# Patient Record
Sex: Female | Born: 1945 | Race: White | Hispanic: No | Smoking: Former smoker
Health system: Southern US, Community
[De-identification: ages and names within clinical notes are randomized; demographics above are authoritative.]

## PROBLEM LIST (undated history)

## (undated) DIAGNOSIS — E785 Hyperlipidemia, unspecified: Secondary | ICD-10-CM

## (undated) DIAGNOSIS — I351 Nonrheumatic aortic (valve) insufficiency: Secondary | ICD-10-CM

## (undated) DIAGNOSIS — I340 Nonrheumatic mitral (valve) insufficiency: Secondary | ICD-10-CM

## (undated) HISTORY — DX: Nonrheumatic aortic (valve) insufficiency: I35.1

## (undated) HISTORY — DX: Nonrheumatic mitral (valve) insufficiency: I34.0

## (undated) HISTORY — DX: Hyperlipidemia, unspecified: E78.5

## (undated) HISTORY — PX: APPENDECTOMY: SHX54

---

## 1997-08-16 ENCOUNTER — Other Ambulatory Visit: Admission: RE | Admit: 1997-08-16 | Discharge: 1997-08-16 | Payer: Self-pay | Admitting: Obstetrics and Gynecology

## 1998-07-11 ENCOUNTER — Encounter: Payer: Self-pay | Admitting: Obstetrics and Gynecology

## 1998-07-11 ENCOUNTER — Ambulatory Visit (HOSPITAL_COMMUNITY): Admission: RE | Admit: 1998-07-11 | Discharge: 1998-07-11 | Payer: Self-pay | Admitting: Obstetrics and Gynecology

## 1998-07-18 ENCOUNTER — Encounter: Payer: Self-pay | Admitting: Obstetrics and Gynecology

## 1998-07-18 ENCOUNTER — Ambulatory Visit (HOSPITAL_COMMUNITY): Admission: RE | Admit: 1998-07-18 | Discharge: 1998-07-18 | Payer: Self-pay | Admitting: Obstetrics and Gynecology

## 1998-10-03 ENCOUNTER — Other Ambulatory Visit: Admission: RE | Admit: 1998-10-03 | Discharge: 1998-10-03 | Payer: Self-pay | Admitting: Obstetrics and Gynecology

## 1999-03-20 ENCOUNTER — Other Ambulatory Visit: Admission: RE | Admit: 1999-03-20 | Discharge: 1999-03-20 | Payer: Self-pay | Admitting: Obstetrics and Gynecology

## 1999-03-20 ENCOUNTER — Encounter (INDEPENDENT_AMBULATORY_CARE_PROVIDER_SITE_OTHER): Payer: Self-pay | Admitting: Specialist

## 1999-12-18 ENCOUNTER — Ambulatory Visit (HOSPITAL_COMMUNITY): Admission: RE | Admit: 1999-12-18 | Discharge: 1999-12-18 | Payer: Self-pay | Admitting: Obstetrics and Gynecology

## 1999-12-18 ENCOUNTER — Encounter: Payer: Self-pay | Admitting: Obstetrics and Gynecology

## 2000-02-14 ENCOUNTER — Other Ambulatory Visit: Admission: RE | Admit: 2000-02-14 | Discharge: 2000-02-14 | Payer: Self-pay | Admitting: Obstetrics and Gynecology

## 2000-04-30 ENCOUNTER — Other Ambulatory Visit: Admission: RE | Admit: 2000-04-30 | Discharge: 2000-04-30 | Payer: Self-pay | Admitting: Obstetrics and Gynecology

## 2000-04-30 ENCOUNTER — Encounter (INDEPENDENT_AMBULATORY_CARE_PROVIDER_SITE_OTHER): Payer: Self-pay

## 2000-05-24 ENCOUNTER — Ambulatory Visit (HOSPITAL_COMMUNITY): Admission: RE | Admit: 2000-05-24 | Discharge: 2000-05-24 | Payer: Self-pay | Admitting: *Deleted

## 2001-02-24 ENCOUNTER — Encounter: Payer: Self-pay | Admitting: Obstetrics and Gynecology

## 2001-02-24 ENCOUNTER — Encounter: Admission: RE | Admit: 2001-02-24 | Discharge: 2001-02-24 | Payer: Self-pay | Admitting: Obstetrics and Gynecology

## 2004-06-26 ENCOUNTER — Other Ambulatory Visit: Admission: RE | Admit: 2004-06-26 | Discharge: 2004-06-26 | Payer: Self-pay | Admitting: Endocrinology

## 2005-05-14 ENCOUNTER — Encounter: Admission: RE | Admit: 2005-05-14 | Discharge: 2005-05-14 | Payer: Self-pay | Admitting: Endocrinology

## 2007-12-31 ENCOUNTER — Encounter: Admission: RE | Admit: 2007-12-31 | Discharge: 2007-12-31 | Payer: Self-pay | Admitting: Endocrinology

## 2007-12-31 ENCOUNTER — Other Ambulatory Visit: Admission: RE | Admit: 2007-12-31 | Discharge: 2007-12-31 | Payer: Self-pay | Admitting: Endocrinology

## 2007-12-31 IMAGING — MG MM SCREEN MAMMOGRAM BILATERAL
4 series · 4 of 4 positions shown · non-contrast
Comparison: Prior studies.

DG SCREEN MAMMOGRAM BILATERAL
Bilateral CC and MLO view(s) were taken.
Prior study comparison: [DATE], bilateral screening mammogram.

DIGITAL SCREENING MAMMOGRAM WITH CAD:

[R CC]
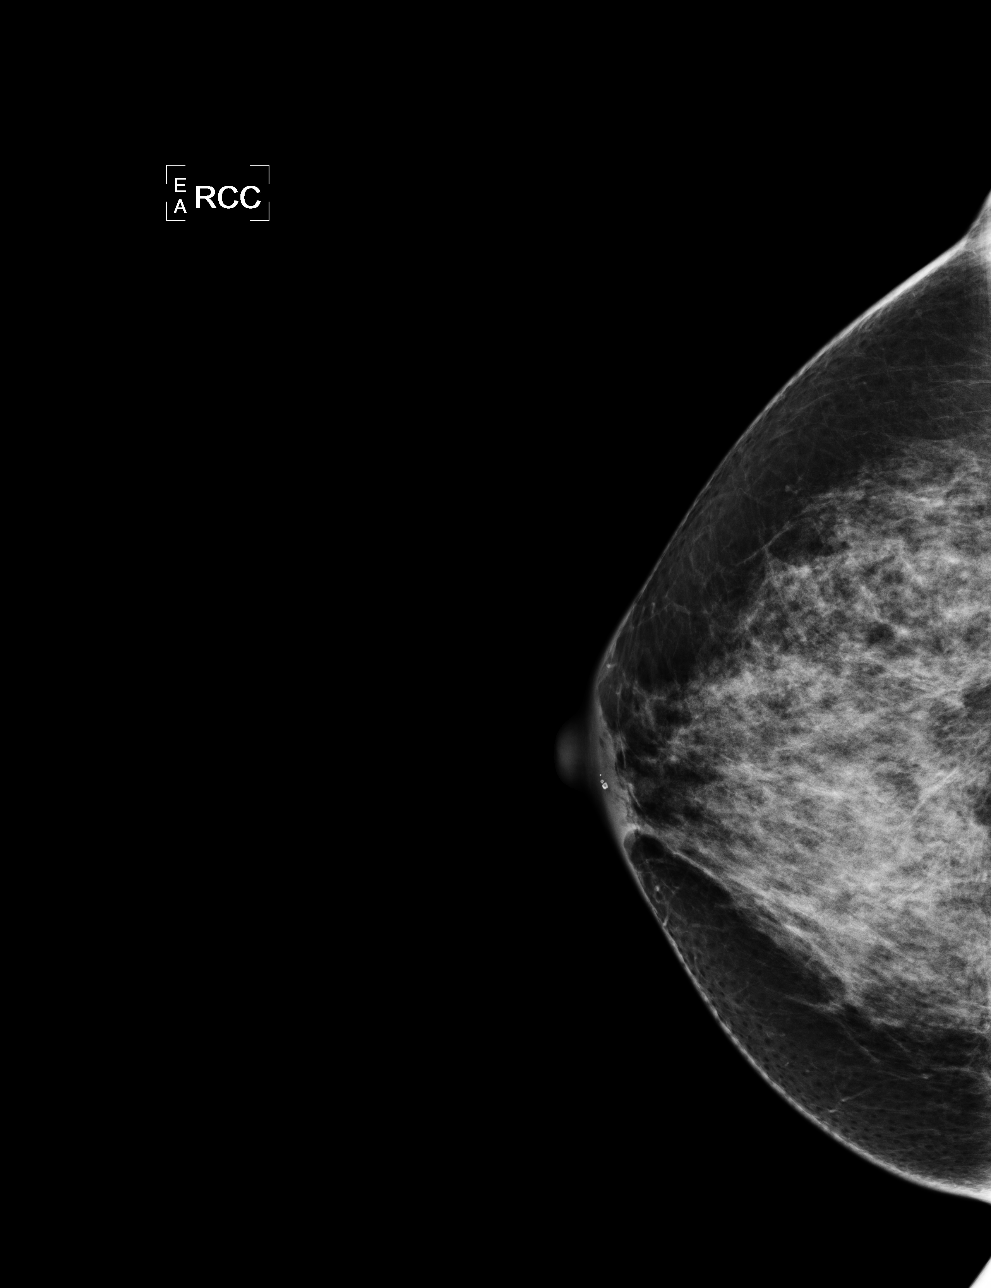

[L CC]
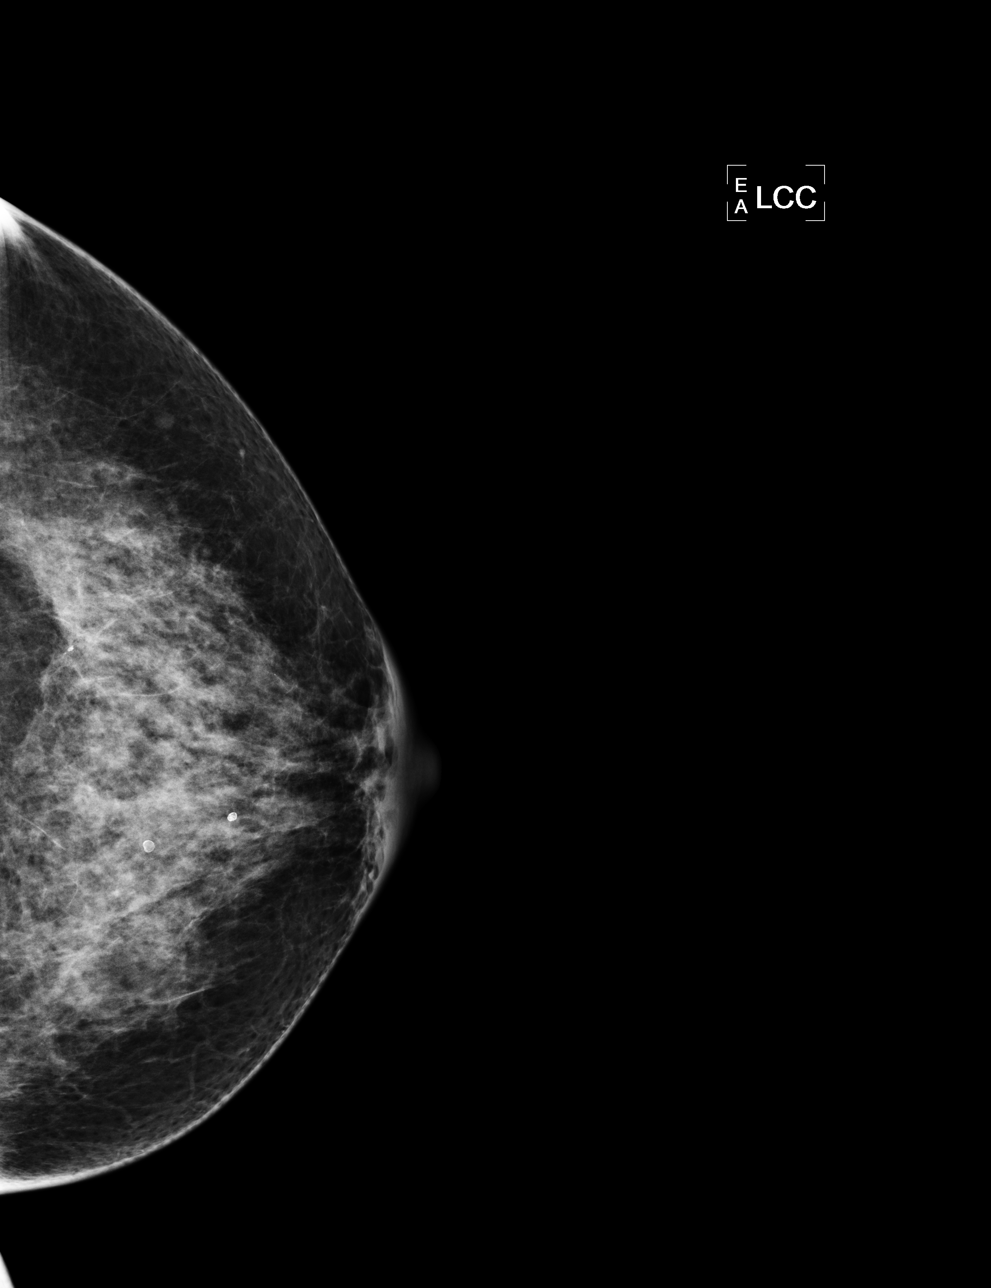

[L MLO]
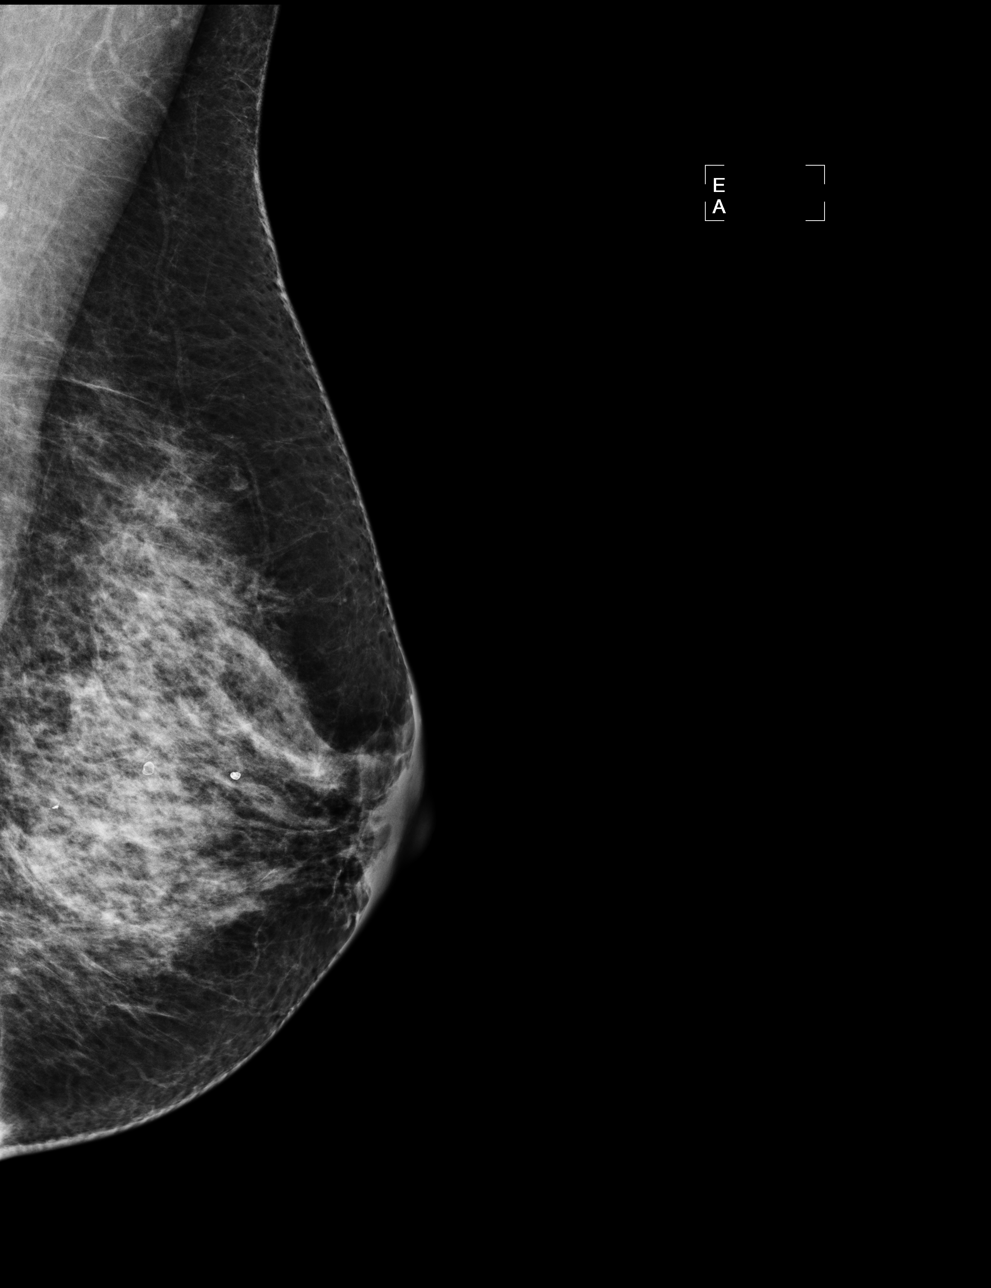

[R MLO]
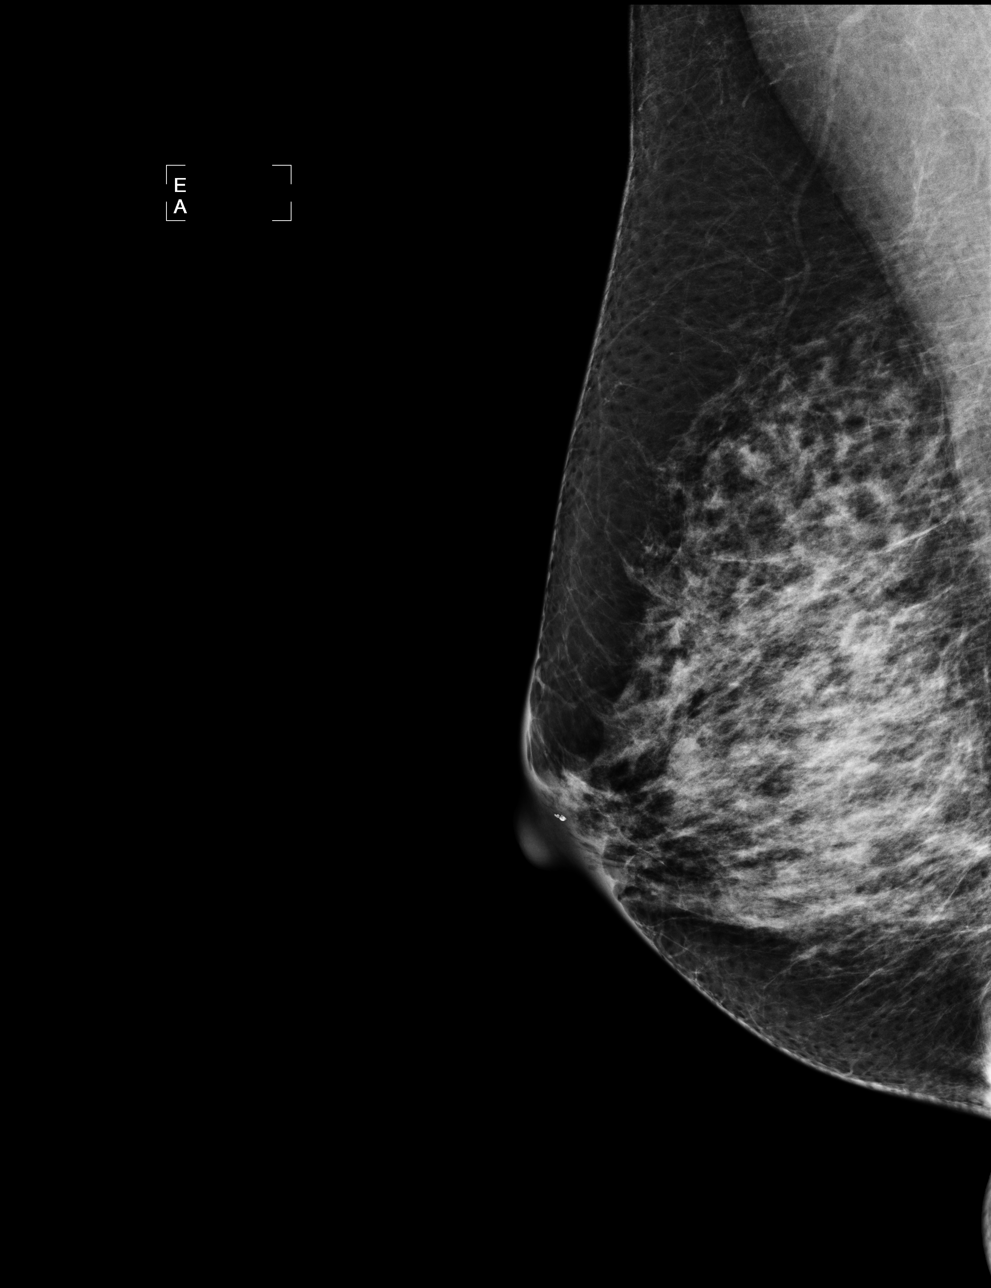

[4 of 4 positions shown; findings below may reference images not displayed]

The breast tissue is heterogeneously dense.  There is no dominant mass, architectural distortion or
calcification to suggest malignancy.
IMPRESSION: No mammographic evidence of malignancy.  Suggest yearly screening mammography.

ASSESSMENT: Negative - BI-RADS 1

Screening mammogram in 1 year.
ANALYZED BY COMPUTER AIDED DETECTION. , THIS PROCEDURE WAS A DIGITAL MAMMOGRAM.

## 2008-02-27 ENCOUNTER — Ambulatory Visit (HOSPITAL_COMMUNITY): Admission: RE | Admit: 2008-02-27 | Discharge: 2008-02-27 | Payer: Self-pay | Admitting: *Deleted

## 2010-10-10 NOTE — Op Note (Signed)
NAMEAMYIA, LODWICK                ACCOUNT NO.:  [id number]   MEDICAL RECORD NO.:  [id number]          PATIENT TYPE:  AMB   LOCATION:  ENDO                         FACILITY:  [facility]   PHYSICIAN:  Georgiana Spinner, M.D.    DATE OF BIRTH:  1946-01-23   DATE OF PROCEDURE:  DATE OF DISCHARGE:                               OPERATIVE REPORT   PROCEDURE:  Colonoscopy.   INDICATIONS:  Colon cancer screening.   ANESTHESIA:  Fentanyl 100 mcg, Versed 9 mg.   PROCEDURE:  With the patient mildly sedated in the left lateral  decubitus position, the Pentax videoscopic pediatric colonoscope was  inserted into the rectum after rectal exam and passed under direct  vision through a tortuous sigmoid colon to reach the cecum, identified  by the ileocecal valve and appendiceal orifice, both of which were  photographed.  From this point, the colonoscope was slowly withdrawn,  taking circumferential views of the colonic mucosa, stopping only in the  rectum, which appeared normal on direct and showed hemorrhoidal tissue  on retroflexed view.  The endoscope was straightened and withdrawn.  Patient's vital signs and pulse oximetry remained stable.  Patient  tolerated the procedure well without apparent complications.   FINDINGS:  Internal hemorrhoids, otherwise an unremarkable examination.   PLAN:  Consider repeat examination in 5-10 years.           ______________________________  Georgiana Spinner, M.D.     GMO/MEDQ  D:  02/27/2008  T:  02/27/2008  Job:  [id number]

## 2010-10-13 NOTE — Procedures (Signed)
[facility]  Patient:    Monique Barnes, Monique Barnes                       MRN: [id number] Proc. Date: 05/24/00 Adm. Date:  [id number] Attending:  Sabino Gasser CC:         Beather Arbour. Thomasena Edis, M.D.   Procedure Report  PROCEDURE:  Colonoscopy.  INDICATIONS FOR PROCEDURE:  Colon cancer screening.  ANESTHESIA:  Demerol 50 mg, Versed 6 mg.  DESCRIPTION OF PROCEDURE:  With the patient mildly sedated in the left lateral decubitus position, a rectal examination was performed which was unremarkable. Subsequently, the Olympus videoscopic colonoscope was inserted in the rectum and passed through a tortuous sigmoid colon to the cecum. The cecum was identified by the ileocecal valve and appendiceal orifice. The prep was good. From this point, the colonoscope was slowly withdrawn taking circumferential views of the entire colonic mucosa visualized until we pulled back to the rectum which appeared normal in direct view and showed internal hemorrhoids on retroflexed view. The colonoscope was straightened and withdrawn. The patients vital signs and pulse oximeter remained stable. The patient tolerated the procedure well without apparent complications.  FINDINGS:  Internal hemorrhoids moderately large, otherwise unremarkable colonoscopic examination to the cecum.  PLAN:  Repeat examination possibly in 5-10 years. DD:  05/24/00 TD:  05/24/00 Job: [id number]

## 2014-01-05 ENCOUNTER — Other Ambulatory Visit: Payer: Self-pay

## 2014-01-05 ENCOUNTER — Other Ambulatory Visit (HOSPITAL_COMMUNITY)
Admission: RE | Admit: 2014-01-05 | Discharge: 2014-01-05 | Disposition: A | Discharge status: Home / Self Care | Payer: Commercial Managed Care - HMO | Source: Ambulatory Visit | Attending: Family Medicine | Admitting: Family Medicine

## 2014-01-05 ENCOUNTER — Other Ambulatory Visit: Payer: Self-pay | Admitting: Family Medicine

## 2014-01-05 DIAGNOSIS — Z124 Encounter for screening for malignant neoplasm of cervix: Secondary | ICD-10-CM | POA: Diagnosis present

## 2014-01-05 DIAGNOSIS — Z1231 Encounter for screening mammogram for malignant neoplasm of breast: Secondary | ICD-10-CM

## 2014-01-08 ENCOUNTER — Ambulatory Visit
Admission: RE | Admit: 2014-01-08 | Discharge: 2014-01-08 | Disposition: A | Discharge status: Home / Self Care | Payer: Commercial Managed Care - HMO | Source: Ambulatory Visit

## 2014-01-08 DIAGNOSIS — Z1231 Encounter for screening mammogram for malignant neoplasm of breast: Secondary | ICD-10-CM

## 2014-01-08 LAB — CYTOLOGY - PAP

## 2014-02-17 ENCOUNTER — Institutional Professional Consult (permissible substitution): Payer: Commercial Managed Care - HMO | Admitting: Cardiology

## 2015-01-13 ENCOUNTER — Other Ambulatory Visit: Payer: Self-pay

## 2015-01-13 DIAGNOSIS — Z1231 Encounter for screening mammogram for malignant neoplasm of breast: Secondary | ICD-10-CM

## 2015-01-14 ENCOUNTER — Ambulatory Visit
Admission: RE | Admit: 2015-01-14 | Discharge: 2015-01-14 | Disposition: A | Discharge status: Home / Self Care | Payer: Medicare HMO | Source: Ambulatory Visit

## 2015-01-14 DIAGNOSIS — Z1231 Encounter for screening mammogram for malignant neoplasm of breast: Secondary | ICD-10-CM

## 2016-04-02 ENCOUNTER — Other Ambulatory Visit: Payer: Self-pay | Admitting: Family Medicine

## 2016-04-02 DIAGNOSIS — M7989 Other specified soft tissue disorders: Secondary | ICD-10-CM

## 2016-04-05 ENCOUNTER — Ambulatory Visit
Admission: RE | Admit: 2016-04-05 | Discharge: 2016-04-05 | Disposition: A | Discharge status: Home / Self Care | Payer: Commercial Managed Care - HMO | Source: Ambulatory Visit | Attending: Family Medicine | Admitting: Family Medicine

## 2016-04-05 DIAGNOSIS — M7989 Other specified soft tissue disorders: Secondary | ICD-10-CM

## 2016-06-25 DIAGNOSIS — I340 Nonrheumatic mitral (valve) insufficiency: Secondary | ICD-10-CM | POA: Diagnosis not present

## 2016-06-25 DIAGNOSIS — I351 Nonrheumatic aortic (valve) insufficiency: Secondary | ICD-10-CM | POA: Diagnosis not present

## 2016-06-25 DIAGNOSIS — E782 Mixed hyperlipidemia: Secondary | ICD-10-CM | POA: Diagnosis not present

## 2016-06-25 DIAGNOSIS — I519 Heart disease, unspecified: Secondary | ICD-10-CM | POA: Diagnosis not present

## 2016-06-28 DIAGNOSIS — I340 Nonrheumatic mitral (valve) insufficiency: Secondary | ICD-10-CM | POA: Diagnosis not present

## 2016-06-28 DIAGNOSIS — I351 Nonrheumatic aortic (valve) insufficiency: Secondary | ICD-10-CM | POA: Diagnosis not present

## 2016-06-28 DIAGNOSIS — I519 Heart disease, unspecified: Secondary | ICD-10-CM | POA: Diagnosis not present

## 2016-06-28 DIAGNOSIS — E782 Mixed hyperlipidemia: Secondary | ICD-10-CM | POA: Diagnosis not present

## 2016-07-02 ENCOUNTER — Other Ambulatory Visit: Payer: Self-pay | Admitting: Family Medicine

## 2016-07-02 DIAGNOSIS — Z1231 Encounter for screening mammogram for malignant neoplasm of breast: Secondary | ICD-10-CM

## 2016-07-23 DIAGNOSIS — I359 Nonrheumatic aortic valve disorder, unspecified: Secondary | ICD-10-CM | POA: Diagnosis not present

## 2016-07-30 ENCOUNTER — Ambulatory Visit
Admission: RE | Admit: 2016-07-30 | Discharge: 2016-07-30 | Disposition: A | Discharge status: Home / Self Care | Payer: Commercial Managed Care - HMO | Source: Ambulatory Visit | Attending: Family Medicine | Admitting: Family Medicine

## 2016-07-30 DIAGNOSIS — Z1231 Encounter for screening mammogram for malignant neoplasm of breast: Secondary | ICD-10-CM

## 2016-09-03 DIAGNOSIS — R011 Cardiac murmur, unspecified: Secondary | ICD-10-CM | POA: Diagnosis not present

## 2016-09-03 DIAGNOSIS — I359 Nonrheumatic aortic valve disorder, unspecified: Secondary | ICD-10-CM | POA: Diagnosis not present

## 2016-09-03 DIAGNOSIS — I340 Nonrheumatic mitral (valve) insufficiency: Secondary | ICD-10-CM | POA: Diagnosis not present

## 2016-09-03 DIAGNOSIS — E785 Hyperlipidemia, unspecified: Secondary | ICD-10-CM | POA: Diagnosis not present

## 2016-12-07 DIAGNOSIS — H6982 Other specified disorders of Eustachian tube, left ear: Secondary | ICD-10-CM | POA: Diagnosis not present

## 2017-01-29 DIAGNOSIS — H5202 Hypermetropia, left eye: Secondary | ICD-10-CM | POA: Diagnosis not present

## 2017-01-29 DIAGNOSIS — H2513 Age-related nuclear cataract, bilateral: Secondary | ICD-10-CM | POA: Diagnosis not present

## 2017-01-29 DIAGNOSIS — H5211 Myopia, right eye: Secondary | ICD-10-CM | POA: Diagnosis not present

## 2017-01-29 DIAGNOSIS — H52223 Regular astigmatism, bilateral: Secondary | ICD-10-CM | POA: Diagnosis not present

## 2017-06-17 DIAGNOSIS — E782 Mixed hyperlipidemia: Secondary | ICD-10-CM | POA: Diagnosis not present

## 2017-07-12 DIAGNOSIS — D1801 Hemangioma of skin and subcutaneous tissue: Secondary | ICD-10-CM | POA: Diagnosis not present

## 2017-07-12 DIAGNOSIS — L570 Actinic keratosis: Secondary | ICD-10-CM | POA: Diagnosis not present

## 2017-07-12 DIAGNOSIS — L821 Other seborrheic keratosis: Secondary | ICD-10-CM | POA: Diagnosis not present

## 2017-09-09 ENCOUNTER — Other Ambulatory Visit: Payer: Self-pay | Admitting: Family Medicine

## 2017-09-09 DIAGNOSIS — Z1231 Encounter for screening mammogram for malignant neoplasm of breast: Secondary | ICD-10-CM

## 2017-09-16 DIAGNOSIS — I340 Nonrheumatic mitral (valve) insufficiency: Secondary | ICD-10-CM | POA: Diagnosis not present

## 2017-09-16 DIAGNOSIS — I351 Nonrheumatic aortic (valve) insufficiency: Secondary | ICD-10-CM | POA: Diagnosis not present

## 2017-09-16 DIAGNOSIS — E785 Hyperlipidemia, unspecified: Secondary | ICD-10-CM | POA: Diagnosis not present

## 2017-10-02 ENCOUNTER — Ambulatory Visit
Admission: RE | Admit: 2017-10-02 | Discharge: 2017-10-02 | Disposition: A | Discharge status: Home / Self Care | Payer: Commercial Managed Care - HMO | Source: Ambulatory Visit | Attending: Family Medicine | Admitting: Family Medicine

## 2017-10-02 DIAGNOSIS — Z1231 Encounter for screening mammogram for malignant neoplasm of breast: Secondary | ICD-10-CM | POA: Diagnosis not present

## 2017-12-23 DIAGNOSIS — I351 Nonrheumatic aortic (valve) insufficiency: Secondary | ICD-10-CM | POA: Diagnosis not present

## 2017-12-23 DIAGNOSIS — I340 Nonrheumatic mitral (valve) insufficiency: Secondary | ICD-10-CM | POA: Diagnosis not present

## 2017-12-23 DIAGNOSIS — E782 Mixed hyperlipidemia: Secondary | ICD-10-CM | POA: Diagnosis not present

## 2018-06-13 DIAGNOSIS — H25813 Combined forms of age-related cataract, bilateral: Secondary | ICD-10-CM | POA: Diagnosis not present

## 2018-06-27 DIAGNOSIS — L814 Other melanin hyperpigmentation: Secondary | ICD-10-CM | POA: Diagnosis not present

## 2018-06-27 DIAGNOSIS — D225 Melanocytic nevi of trunk: Secondary | ICD-10-CM | POA: Diagnosis not present

## 2018-06-27 DIAGNOSIS — D2372 Other benign neoplasm of skin of left lower limb, including hip: Secondary | ICD-10-CM | POA: Diagnosis not present

## 2018-06-27 DIAGNOSIS — D1801 Hemangioma of skin and subcutaneous tissue: Secondary | ICD-10-CM | POA: Diagnosis not present

## 2018-06-27 DIAGNOSIS — I788 Other diseases of capillaries: Secondary | ICD-10-CM | POA: Diagnosis not present

## 2018-06-27 DIAGNOSIS — L821 Other seborrheic keratosis: Secondary | ICD-10-CM | POA: Diagnosis not present

## 2018-06-27 DIAGNOSIS — L918 Other hypertrophic disorders of the skin: Secondary | ICD-10-CM | POA: Diagnosis not present

## 2018-07-09 ENCOUNTER — Telehealth: Payer: Self-pay

## 2018-07-09 NOTE — Telephone Encounter (Signed)
NOTES ON FILE 

## 2018-08-05 DIAGNOSIS — E782 Mixed hyperlipidemia: Secondary | ICD-10-CM | POA: Diagnosis not present

## 2018-09-18 ENCOUNTER — Ambulatory Visit: Payer: Medicare PPO | Admitting: Cardiovascular Disease

## 2018-11-17 ENCOUNTER — Other Ambulatory Visit: Payer: Self-pay | Admitting: Family Medicine

## 2018-11-17 DIAGNOSIS — Z1231 Encounter for screening mammogram for malignant neoplasm of breast: Secondary | ICD-10-CM

## 2018-12-25 DIAGNOSIS — I351 Nonrheumatic aortic (valve) insufficiency: Secondary | ICD-10-CM | POA: Diagnosis not present

## 2018-12-25 DIAGNOSIS — E782 Mixed hyperlipidemia: Secondary | ICD-10-CM | POA: Diagnosis not present

## 2018-12-25 DIAGNOSIS — I340 Nonrheumatic mitral (valve) insufficiency: Secondary | ICD-10-CM | POA: Diagnosis not present

## 2018-12-30 ENCOUNTER — Other Ambulatory Visit: Payer: Self-pay

## 2018-12-30 ENCOUNTER — Ambulatory Visit
Admission: RE | Admit: 2018-12-30 | Discharge: 2018-12-30 | Disposition: A | Discharge status: Home / Self Care | Payer: Medicare PPO | Source: Ambulatory Visit | Attending: Family Medicine | Admitting: Family Medicine

## 2018-12-30 DIAGNOSIS — Z1231 Encounter for screening mammogram for malignant neoplasm of breast: Secondary | ICD-10-CM | POA: Diagnosis not present

## 2019-01-21 NOTE — Progress Notes (Signed)
Chief Complaint  Patient presents with  . New Patient (Initial Visit)    History of Present Illness: 73 yo female with history of hyperlipidemia, aortic insufficiency, mitral valve regurgitation here today for cardiac follow up. She has been followed by Dr. Wynonia Lawman. I am meeting her for the first time today due to his absence due to illness. Echo April 2019 with LVEF=60%. Moderate AI, moderate MR. She tells me today that she feels great. No chest pain, dyspnea, palpitations, lower extremity edema, orthopnea, PND, dizziness, near syncope or syncope.   Primary Care Physician: Leighton Ruff, MD  Past Medical History:  Diagnosis Date  . Aortic valve insufficiency   . Hyperlipidemia   . Mitral valve regurgitation     Past Surgical History:  Procedure Laterality Date  . APPENDECTOMY      Current Outpatient Medications  Medication Sig Dispense Refill  . atorvastatin (LIPITOR) 10 MG tablet Take 1 tablet by mouth daily.    . fenofibrate 54 MG tablet Take 1 tablet by mouth daily.     No current facility-administered medications for this visit.     No Known Allergies  Social History   Socioeconomic History  . Marital status: Married    Spouse name: Not on file  . Number of children: Not on file  . Years of education: Not on file  . Highest education level: Not on file  Occupational History  . 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 Financial resource strain: Not on file  . Food insecurity    Worry: Not on file    Inability: Not on file  . Transportation needs    Medical: Not on file    Non-medical: Not on file  Tobacco Use  . Smoking status: Former Research scientist (life sciences)  . Smokeless tobacco: Never Used  Substance and Sexual Activity  . Alcohol use: Yes    Comment: Social-one glass of wine per week  . Drug use: Never  . Sexual activity: Not on file    Comment: MARRIED  Lifestyle  . Physical activity    Days per week: Not on file    Minutes per session:  Not on file  . Stress: Not on file  Relationships  . Social Herbalist on phone: Not on file    Gets together: Not on file    Attends religious service: Not on file    Active member of club or organization: Not on file    Attends meetings of clubs or organizations: Not on file    Relationship status: Not on file  . Intimate partner violence    Fear of current or ex partner: Not on file    Emotionally abused: Not on file    Physically abused: Not on file    Forced sexual activity: Not on file  Other Topics Concern  . Not on file  Social History Narrative  . Not on file    Family History  Problem Relation Age of Onset  . Healthy Mother   . Mesothelioma Father   . Healthy Sister   . Cancer - Prostate Brother   . Healthy Brother   . Healthy Sister   . Healthy Sister     Review of Systems:  As stated in the HPI and otherwise negative.   BP 130/76   Pulse 84   Ht 5\' 2"  (1.575 m)   Wt 122 lb 1.9 oz (55.4 kg)   SpO2 97%   BMI 22.34 kg/m  Physical Examination: General: Well developed, well nourished, NAD  HEENT: OP clear, mucus membranes moist  SKIN: warm, dry. No rashes. Neuro: No focal deficits  Musculoskeletal: Muscle strength 5/5 all ext  Psychiatric: Mood and affect normal  Neck: No JVD, no carotid bruits, no thyromegaly, no lymphadenopathy.  Lungs:Clear bilaterally, no wheezes, rhonci, crackles Cardiovascular: Regular rate and rhythm. Soft systolic murmur.  Abdomen:Soft. Bowel sounds present. Non-tender.  Extremities: No lower extremity edema. Pulses are 2 + in the bilateral DP/PT.  EKG:  EKG is ordered today. The ekg ordered today demonstrates NSR, rate 84 bpm. Non-specific ST and T wave abn  Recent Labs: No results found for requested labs within last 8760 hours.   Lipid Panel No results found for: CHOL, TRIG, HDL, CHOLHDL, VLDL, LDLCALC, LDLDIRECT   Wt Readings from Last 3 Encounters:  01/22/19 122 lb 1.9 oz (55.4 kg)     Assessment and  Plan:   1. Aortic valve insufficiency: Moderate by echo in April 2019. Will repeat echo now.   2. Mitral valve regurgitation: Moderate by echo in April 2019. Repeat echo now.   3. Hyperlipidemia: Lipids followed in primary care and well controlled. Continue current therapy.   Current medicines are reviewed at length with the patient today.  The patient does not have concerns regarding medicines.  The following changes have been made:  no change  Labs/ tests ordered today include:   Orders Placed This Encounter  Procedures  . EKG 12-Lead   Disposition:   FU with me in one year.   Signed, Verne Carrowhristopher McAlhany, MD 01/22/2019 9:17 AM    [facility] [address], [place], [place]  [address] Phone: [phone]) [phone]; Fax: [phone]

## 2019-01-22 ENCOUNTER — Ambulatory Visit (INDEPENDENT_AMBULATORY_CARE_PROVIDER_SITE_OTHER): Payer: Medicare PPO | Admitting: Cardiovascular Disease

## 2019-01-22 ENCOUNTER — Encounter: Payer: Self-pay | Admitting: Cardiovascular Disease

## 2019-01-22 ENCOUNTER — Other Ambulatory Visit: Payer: Self-pay

## 2019-01-22 VITALS — BP 130/76 | HR 84 | Ht 62.0 in | Wt 122.1 lb

## 2019-01-22 DIAGNOSIS — I351 Nonrheumatic aortic (valve) insufficiency: Secondary | ICD-10-CM | POA: Diagnosis not present

## 2019-01-22 DIAGNOSIS — I340 Nonrheumatic mitral (valve) insufficiency: Secondary | ICD-10-CM

## 2019-01-22 DIAGNOSIS — E7800 Pure hypercholesterolemia, unspecified: Secondary | ICD-10-CM | POA: Diagnosis not present

## 2019-01-22 NOTE — Patient Instructions (Signed)
Medication Instructions:  Your physician recommends that you continue on your current medications as directed. Please refer to the Current Medication list given to you today.  If you need a refill on your cardiac medications before your next appointment, please call your pharmacy.   Lab work: None If you have labs (blood work) drawn today and your tests are completely normal, you will receive your results only by: . MyChart Message (if you have MyChart) OR . A paper copy in the mail If you have any lab test that is abnormal or we need to change your treatment, we will call you to review the results.  Testing/Procedures: Your physician has requested that you have an echocardiogram. Echocardiography is a painless test that uses sound waves to create images of your heart. It provides your doctor with information about the size and shape of your heart and how well your heart's chambers and valves are working. This procedure takes approximately one hour. There are no restrictions for this procedure.   Follow-Up: At [facility], you and your health needs are our priority.  As part of our continuing mission to provide you with exceptional heart care, we have created designated Provider Care Teams.  These Care Teams include your primary Cardiologist (physician) and Advanced Practice Providers (APPs -  Physician Assistants and Nurse Practitioners) who all work together to provide you with the care you need, when you need it. You will need a follow up appointment in 12 months.  Please call our office 2 months in advance to schedule this appointment.  You may see Dr. McAlhany or one of the following Advanced Practice Providers on your designated Care Team:   Brittainy Simmons, PA-C Dayna Dunn, PA-C . Michele Lenze, PA-C  Any Other Special Instructions Will Be Listed Below (If Applicable).    

## 2019-01-26 ENCOUNTER — Ambulatory Visit (HOSPITAL_COMMUNITY): Payer: Medicare PPO | Attending: Cardiovascular Disease

## 2019-01-26 ENCOUNTER — Other Ambulatory Visit: Payer: Self-pay

## 2019-01-26 DIAGNOSIS — I340 Nonrheumatic mitral (valve) insufficiency: Secondary | ICD-10-CM | POA: Insufficient documentation

## 2019-01-26 DIAGNOSIS — I351 Nonrheumatic aortic (valve) insufficiency: Secondary | ICD-10-CM | POA: Diagnosis not present

## 2019-02-04 DIAGNOSIS — W57XXXA Bitten or stung by nonvenomous insect and other nonvenomous arthropods, initial encounter: Secondary | ICD-10-CM | POA: Diagnosis not present

## 2019-02-04 DIAGNOSIS — L299 Pruritus, unspecified: Secondary | ICD-10-CM | POA: Diagnosis not present

## 2019-02-04 DIAGNOSIS — S40862A Insect bite (nonvenomous) of left upper arm, initial encounter: Secondary | ICD-10-CM | POA: Diagnosis not present

## 2019-02-10 DIAGNOSIS — R229 Localized swelling, mass and lump, unspecified: Secondary | ICD-10-CM | POA: Diagnosis not present

## 2019-02-13 ENCOUNTER — Other Ambulatory Visit: Payer: Self-pay | Admitting: Family Medicine

## 2019-02-13 DIAGNOSIS — IMO0002 Reserved for concepts with insufficient information to code with codable children: Secondary | ICD-10-CM

## 2019-02-17 ENCOUNTER — Other Ambulatory Visit: Payer: Medicare PPO

## 2019-02-18 ENCOUNTER — Ambulatory Visit
Admission: RE | Admit: 2019-02-18 | Discharge: 2019-02-18 | Disposition: A | Discharge status: Home / Self Care | Payer: Medicare PPO | Source: Ambulatory Visit | Attending: Family Medicine | Admitting: Family Medicine

## 2019-02-18 DIAGNOSIS — R2232 Localized swelling, mass and lump, left upper limb: Secondary | ICD-10-CM | POA: Diagnosis not present

## 2019-02-18 DIAGNOSIS — IMO0002 Reserved for concepts with insufficient information to code with codable children: Secondary | ICD-10-CM

## 2019-07-23 ENCOUNTER — Ambulatory Visit: Payer: Medicare PPO | Attending: Internal Medicine

## 2019-07-23 DIAGNOSIS — Z23 Encounter for immunization: Secondary | ICD-10-CM | POA: Insufficient documentation

## 2019-07-23 NOTE — Progress Notes (Signed)
   Covid-19 Vaccination Clinic  Name:  Monique Barnes    MRN: [id number] DOB: 06/14/1945  07/23/2019  [name] was observed post Covid-19 immunization for 15 minutes without incidence. She was provided with Vaccine Information Sheet and instruction to access the V-Safe system.   [name] was instructed to call 911 with any severe reactions post vaccine: Marland Kitchen Difficulty breathing  . Swelling of your face and throat  . A fast heartbeat  . A bad rash all over your body  . Dizziness and weakness    Immunizations Administered    Name Date Dose VIS Date Route   Pfizer COVID-19 Vaccine 07/23/2019  3:00 PM 0.3 mL 05/08/2019 Intramuscular   Manufacturer: [employer], [employer]   Lot: [id number]   NDC: [address]-4

## 2019-08-05 DIAGNOSIS — L814 Other melanin hyperpigmentation: Secondary | ICD-10-CM | POA: Diagnosis not present

## 2019-08-05 DIAGNOSIS — D225 Melanocytic nevi of trunk: Secondary | ICD-10-CM | POA: Diagnosis not present

## 2019-08-05 DIAGNOSIS — L821 Other seborrheic keratosis: Secondary | ICD-10-CM | POA: Diagnosis not present

## 2019-08-05 DIAGNOSIS — D1801 Hemangioma of skin and subcutaneous tissue: Secondary | ICD-10-CM | POA: Diagnosis not present

## 2019-08-05 DIAGNOSIS — D2372 Other benign neoplasm of skin of left lower limb, including hip: Secondary | ICD-10-CM | POA: Diagnosis not present

## 2019-08-05 DIAGNOSIS — D224 Melanocytic nevi of scalp and neck: Secondary | ICD-10-CM | POA: Diagnosis not present

## 2019-08-18 ENCOUNTER — Ambulatory Visit: Payer: Medicare PPO | Attending: Internal Medicine

## 2019-08-18 DIAGNOSIS — Z23 Encounter for immunization: Secondary | ICD-10-CM

## 2019-08-18 NOTE — Progress Notes (Signed)
   Covid-19 Vaccination Clinic  Name:  Monique Barnes    MRN: [id number] DOB: 27-Feb-1946  08/18/2019  Monique Barnes was observed post Covid-19 immunization for 15 minutes without incident. She was provided with Vaccine Information Sheet and instruction to access the V-Safe system.   Monique Barnes was instructed to call 911 with any severe reactions post vaccine: Marland Kitchen Difficulty breathing  . Swelling of face and throat  . A fast heartbeat  . A bad rash all over body  . Dizziness and weakness   Immunizations Administered    Name Date Dose VIS Date Route   Pfizer COVID-19 Vaccine 08/18/2019  2:55 PM 0.3 mL 05/08/2019 Intramuscular   Manufacturer: [employer], [employer]   Lot: [id number]   NDC: [address]-6

## 2019-09-10 DIAGNOSIS — I351 Nonrheumatic aortic (valve) insufficiency: Secondary | ICD-10-CM | POA: Diagnosis not present

## 2019-09-10 DIAGNOSIS — E782 Mixed hyperlipidemia: Secondary | ICD-10-CM | POA: Diagnosis not present

## 2019-11-23 ENCOUNTER — Other Ambulatory Visit: Payer: Self-pay | Admitting: Family Medicine

## 2019-11-23 DIAGNOSIS — Z1231 Encounter for screening mammogram for malignant neoplasm of breast: Secondary | ICD-10-CM

## 2019-12-31 ENCOUNTER — Other Ambulatory Visit: Payer: Self-pay

## 2019-12-31 ENCOUNTER — Ambulatory Visit
Admission: RE | Admit: 2019-12-31 | Discharge: 2019-12-31 | Disposition: A | Discharge status: Home / Self Care | Payer: Medicare PPO | Source: Ambulatory Visit | Attending: Family Medicine | Admitting: Family Medicine

## 2019-12-31 DIAGNOSIS — Z1231 Encounter for screening mammogram for malignant neoplasm of breast: Secondary | ICD-10-CM

## 2020-03-15 DIAGNOSIS — L2089 Other atopic dermatitis: Secondary | ICD-10-CM | POA: Diagnosis not present

## 2020-03-17 DIAGNOSIS — H524 Presbyopia: Secondary | ICD-10-CM | POA: Diagnosis not present

## 2020-03-17 DIAGNOSIS — H25812 Combined forms of age-related cataract, left eye: Secondary | ICD-10-CM | POA: Diagnosis not present

## 2020-03-17 DIAGNOSIS — H5211 Myopia, right eye: Secondary | ICD-10-CM | POA: Diagnosis not present

## 2020-03-17 DIAGNOSIS — H52223 Regular astigmatism, bilateral: Secondary | ICD-10-CM | POA: Diagnosis not present

## 2020-03-17 DIAGNOSIS — H25811 Combined forms of age-related cataract, right eye: Secondary | ICD-10-CM | POA: Diagnosis not present

## 2020-03-17 DIAGNOSIS — H5202 Hypermetropia, left eye: Secondary | ICD-10-CM | POA: Diagnosis not present

## 2020-03-22 DIAGNOSIS — I351 Nonrheumatic aortic (valve) insufficiency: Secondary | ICD-10-CM | POA: Diagnosis not present

## 2020-03-22 DIAGNOSIS — E782 Mixed hyperlipidemia: Secondary | ICD-10-CM | POA: Diagnosis not present

## 2020-03-22 DIAGNOSIS — I340 Nonrheumatic mitral (valve) insufficiency: Secondary | ICD-10-CM | POA: Diagnosis not present

## 2020-08-04 DIAGNOSIS — L821 Other seborrheic keratosis: Secondary | ICD-10-CM | POA: Diagnosis not present

## 2020-08-04 DIAGNOSIS — L570 Actinic keratosis: Secondary | ICD-10-CM | POA: Diagnosis not present

## 2020-08-04 DIAGNOSIS — L814 Other melanin hyperpigmentation: Secondary | ICD-10-CM | POA: Diagnosis not present

## 2020-08-04 DIAGNOSIS — L308 Other specified dermatitis: Secondary | ICD-10-CM | POA: Diagnosis not present

## 2020-08-04 DIAGNOSIS — D225 Melanocytic nevi of trunk: Secondary | ICD-10-CM | POA: Diagnosis not present

## 2020-08-04 DIAGNOSIS — D1801 Hemangioma of skin and subcutaneous tissue: Secondary | ICD-10-CM | POA: Diagnosis not present

## 2020-08-04 DIAGNOSIS — D2271 Melanocytic nevi of right lower limb, including hip: Secondary | ICD-10-CM | POA: Diagnosis not present

## 2020-09-01 DIAGNOSIS — I340 Nonrheumatic mitral (valve) insufficiency: Secondary | ICD-10-CM | POA: Diagnosis not present

## 2020-09-01 DIAGNOSIS — I351 Nonrheumatic aortic (valve) insufficiency: Secondary | ICD-10-CM | POA: Diagnosis not present

## 2020-09-01 DIAGNOSIS — E782 Mixed hyperlipidemia: Secondary | ICD-10-CM | POA: Diagnosis not present

## 2020-09-01 DIAGNOSIS — Z6823 Body mass index (BMI) 23.0-23.9, adult: Secondary | ICD-10-CM | POA: Diagnosis not present

## 2021-03-20 ENCOUNTER — Other Ambulatory Visit: Payer: Self-pay | Admitting: Family Medicine

## 2021-03-20 DIAGNOSIS — Z1231 Encounter for screening mammogram for malignant neoplasm of breast: Secondary | ICD-10-CM

## 2021-03-23 ENCOUNTER — Ambulatory Visit
Admission: RE | Admit: 2021-03-23 | Discharge: 2021-03-23 | Disposition: A | Discharge status: Home / Self Care | Payer: Medicare PPO | Source: Ambulatory Visit | Attending: Family Medicine | Admitting: Family Medicine

## 2021-03-23 DIAGNOSIS — Z1231 Encounter for screening mammogram for malignant neoplasm of breast: Secondary | ICD-10-CM | POA: Diagnosis not present

## 2021-04-05 DIAGNOSIS — Z0000 Encounter for general adult medical examination without abnormal findings: Secondary | ICD-10-CM | POA: Diagnosis not present

## 2021-04-05 DIAGNOSIS — I351 Nonrheumatic aortic (valve) insufficiency: Secondary | ICD-10-CM | POA: Diagnosis not present

## 2021-04-05 DIAGNOSIS — E782 Mixed hyperlipidemia: Secondary | ICD-10-CM | POA: Diagnosis not present

## 2021-04-05 DIAGNOSIS — Z1389 Encounter for screening for other disorder: Secondary | ICD-10-CM | POA: Diagnosis not present

## 2021-04-05 DIAGNOSIS — Z79899 Other long term (current) drug therapy: Secondary | ICD-10-CM | POA: Diagnosis not present

## 2021-04-05 DIAGNOSIS — I340 Nonrheumatic mitral (valve) insufficiency: Secondary | ICD-10-CM | POA: Diagnosis not present

## 2021-04-12 ENCOUNTER — Other Ambulatory Visit: Payer: Self-pay | Admitting: Family Medicine

## 2021-04-12 DIAGNOSIS — M8580 Other specified disorders of bone density and structure, unspecified site: Secondary | ICD-10-CM

## 2021-04-28 ENCOUNTER — Other Ambulatory Visit: Payer: Self-pay

## 2021-04-28 ENCOUNTER — Ambulatory Visit: Payer: Medicare PPO | Admitting: Cardiovascular Disease

## 2021-04-28 ENCOUNTER — Encounter: Payer: Self-pay | Admitting: Cardiovascular Disease

## 2021-04-28 VITALS — BP 132/84 | HR 87 | Ht 62.0 in | Wt 125.0 lb

## 2021-04-28 DIAGNOSIS — I340 Nonrheumatic mitral (valve) insufficiency: Secondary | ICD-10-CM

## 2021-04-28 DIAGNOSIS — I351 Nonrheumatic aortic (valve) insufficiency: Secondary | ICD-10-CM

## 2021-04-28 NOTE — Patient Instructions (Signed)
Medication Instructions:  No changes *If you need a refill on your cardiac medications before your next appointment, please call your pharmacy*   Lab Work: none  Testing/Procedures: Your physician has requested that you have an echocardiogram. Echocardiography is a painless test that uses sound waves to create images of your heart. It provides your doctor with information about the size and shape of your heart and how well your heart's chambers and valves are working. This procedure takes approximately one hour. There are no restrictions for this procedure.   Follow-Up: At [facility], you and your health needs are our priority.  As part of our continuing mission to provide you with exceptional heart care, we have created designated Provider Care Teams.  These Care Teams include your primary Cardiologist (physician) and Advanced Practice Providers (APPs -  Physician Assistants and Nurse Practitioners) who all work together to provide you with the care you need, when you need it.  We recommend signing up for the patient portal called "MyChart".  Sign up information is provided on this After Visit Summary.  MyChart is used to connect with patients for Virtual Visits (Telemedicine).  Patients are able to view lab/test results, encounter notes, upcoming appointments, etc.  Non-urgent messages can be sent to your provider as well.   To learn more about what you can do with MyChart, go to [email or web address].    Your next appointment:   12 month(s)  The format for your next appointment:   In Person  Provider:   Christopher McAlhany, MD     Other Instructions   

## 2021-04-28 NOTE — Progress Notes (Signed)
Chief Complaint  Patient presents with   Follow-up    Aortic valve disease   History of Present Illness: 75 yo female with history of hyperlipidemia, aortic insufficiency and mitral valve regurgitation here today for cardiac follow up. She has been followed by Dr. Wynonia Lawman. I met her for the first time in August 2020. Echo April 2019 with LVEF=60%. Moderate AI, moderate MR. She was feeling well when I met hr in 2020 and had no complaints. Echo August 2020 with LVEF=55-60%, mild to moderate mitral regurgitation, mild to moderate aortic valve insufficiency.   She is here today for follow up. The patient denies any chest pain, dyspnea, palpitations, lower extremity edema, orthopnea, PND, dizziness, near syncope or syncope.    Primary Care Physician: Kathyrn Lass, MD  Past Medical History:  Diagnosis Date   Aortic valve insufficiency    Hyperlipidemia    Mitral valve regurgitation     Past Surgical History:  Procedure Laterality Date   APPENDECTOMY      Current Outpatient Medications  Medication Sig Dispense Refill   atorvastatin (LIPITOR) 10 MG tablet Take 1 tablet by mouth daily.     fenofibrate 54 MG tablet Take 1 tablet by mouth daily.     No current facility-administered medications for this visit.    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  Tobacco Use   Smoking status: Former   Smokeless tobacco: Never  Substance and Sexual Activity   Alcohol use: Yes    Comment: Social-one glass of wine per week   Drug use: Never   Sexual activity: Not on file    Comment: MARRIED  Other Topics Concern   Not on file  Social History Narrative   Not on file   Social Determinants of Health   Financial Resource Strain: Not on  file  Food Insecurity: Not on file  Transportation Needs: Not on file  Physical Activity: Not on file  Stress: Not on file  Social Connections: Not on file  Intimate Partner Violence: Not on file    Family History  Problem Relation Age of Onset   Healthy Mother    Mesothelioma Father    Healthy Sister    Cancer - Prostate Brother    Healthy Brother    Healthy Sister    Healthy Sister     Review of Systems:  As stated in the HPI and otherwise negative.   BP 132/84   Pulse 87   Ht 5' 2" (1.575 m)   Wt 125 lb (56.7 kg)   SpO2 98%   BMI 22.86 kg/m   Physical Examination:  General: Well developed, well nourished, NAD  HEENT: OP clear, mucus membranes moist  SKIN: warm, dry. No rashes. Neuro: No focal deficits  Musculoskeletal: Muscle strength 5/5 all ext  Psychiatric: Mood and affect normal  Neck: No JVD, no carotid bruits, no thyromegaly, no lymphadenopathy.  Lungs:Clear bilaterally, no wheezes, rhonci, crackles Cardiovascular: Regular rate and rhythm. Soft systolic murmur.  Abdomen:Soft. Bowel sounds present. Non-tender.  Extremities: No lower extremity edema. Pulses are 2 + in the bilateral DP/PT.  EKG:  EKG is ordered today. The ekg ordered today demonstrates Sinus  Echo August 2020:  1. The  left ventricle has normal systolic function, with an ejection  fraction of 55-60%. The cavity size was normal. Left ventricular diastolic  Doppler parameters are consistent with impaired relaxation.   2. The right ventricle has normal systolic function. The cavity was  normal. There is no increase in right ventricular wall thickness.   3. Moderate thickening of the mitral valve leaflet. Moderate  calcification of the mitral valve leaflet. Mitral valve regurgitation is  mild to moderate by color flow Doppler.   4. The aortic valve is tricuspid. Moderate thickening of the aortic  valve. Mild calcification of the aortic valve. Aortic valve regurgitation  is mild to moderate  by color flow Doppler.   5. The aorta is normal unless otherwise noted.   Recent Labs: No results found for requested labs within last 8760 hours.   Lipid Panel No results found for: CHOL, TRIG, HDL, CHOLHDL, VLDL, LDLCALC, LDLDIRECT   Wt Readings from Last 3 Encounters:  04/28/21 125 lb (56.7 kg)  01/22/19 122 lb 1.9 oz (55.4 kg)     Assessment and Plan:   1. Aortic valve insufficiency: Moderate by echo in August 2020. Will repeat echo now.    2. Mitral valve regurgitation: Moderate by echo in August 2020. Echo now  3. Hyperlipidemia: Lipids followed in primary care and well controlled. LDL 72. Continue statin  Current medicines are reviewed at length with the patient today.  The patient does not have concerns regarding medicines.  The following changes have been made:  no change  Labs/ tests ordered today include:   Orders Placed This Encounter  Procedures   EKG 12-Lead   ECHOCARDIOGRAM COMPLETE    Disposition:   F/U with me in one year.   Signed, Lauree Chandler, MD 04/28/2021 11:45 AM    [facility] [place], [place], [place]  [address] Phone: [phone]; Fax: [phone]

## 2021-05-16 ENCOUNTER — Ambulatory Visit (HOSPITAL_COMMUNITY): Payer: Medicare PPO | Attending: Cardiology

## 2021-05-16 ENCOUNTER — Other Ambulatory Visit: Payer: Self-pay

## 2021-05-16 ENCOUNTER — Ambulatory Visit
Admission: RE | Admit: 2021-05-16 | Discharge: 2021-05-16 | Disposition: A | Discharge status: Home / Self Care | Payer: Medicare PPO | Source: Ambulatory Visit | Attending: Family Medicine | Admitting: Family Medicine

## 2021-05-16 DIAGNOSIS — M8580 Other specified disorders of bone density and structure, unspecified site: Secondary | ICD-10-CM

## 2021-05-16 DIAGNOSIS — I340 Nonrheumatic mitral (valve) insufficiency: Secondary | ICD-10-CM | POA: Diagnosis not present

## 2021-05-16 DIAGNOSIS — M810 Age-related osteoporosis without current pathological fracture: Secondary | ICD-10-CM | POA: Diagnosis not present

## 2021-05-16 DIAGNOSIS — M85852 Other specified disorders of bone density and structure, left thigh: Secondary | ICD-10-CM | POA: Diagnosis not present

## 2021-05-16 DIAGNOSIS — I351 Nonrheumatic aortic (valve) insufficiency: Secondary | ICD-10-CM | POA: Insufficient documentation

## 2021-05-16 LAB — ECHOCARDIOGRAM COMPLETE
Area-P 1/2: 3.37 cm2
MV M vel: 5.34 m/s
MV Peak grad: 114.1 mmHg
P 1/2 time: 297 msec
Radius: 0.5 cm
S' Lateral: 3.1 cm

## 2021-06-30 DIAGNOSIS — H524 Presbyopia: Secondary | ICD-10-CM | POA: Diagnosis not present

## 2021-06-30 DIAGNOSIS — H2513 Age-related nuclear cataract, bilateral: Secondary | ICD-10-CM | POA: Diagnosis not present

## 2021-08-07 DIAGNOSIS — D692 Other nonthrombocytopenic purpura: Secondary | ICD-10-CM | POA: Diagnosis not present

## 2021-08-07 DIAGNOSIS — L308 Other specified dermatitis: Secondary | ICD-10-CM | POA: Diagnosis not present

## 2021-08-07 DIAGNOSIS — L821 Other seborrheic keratosis: Secondary | ICD-10-CM | POA: Diagnosis not present

## 2021-08-07 DIAGNOSIS — L814 Other melanin hyperpigmentation: Secondary | ICD-10-CM | POA: Diagnosis not present

## 2021-08-07 DIAGNOSIS — D225 Melanocytic nevi of trunk: Secondary | ICD-10-CM | POA: Diagnosis not present

## 2021-08-07 DIAGNOSIS — D1801 Hemangioma of skin and subcutaneous tissue: Secondary | ICD-10-CM | POA: Diagnosis not present

## 2021-08-08 DIAGNOSIS — H2511 Age-related nuclear cataract, right eye: Secondary | ICD-10-CM | POA: Diagnosis not present

## 2021-08-08 DIAGNOSIS — H25811 Combined forms of age-related cataract, right eye: Secondary | ICD-10-CM | POA: Diagnosis not present

## 2021-08-22 DIAGNOSIS — H2512 Age-related nuclear cataract, left eye: Secondary | ICD-10-CM | POA: Diagnosis not present

## 2021-08-22 DIAGNOSIS — H25812 Combined forms of age-related cataract, left eye: Secondary | ICD-10-CM | POA: Diagnosis not present

## 2021-08-22 DIAGNOSIS — H52202 Unspecified astigmatism, left eye: Secondary | ICD-10-CM | POA: Diagnosis not present

## 2022-03-02 ENCOUNTER — Other Ambulatory Visit: Payer: Self-pay | Admitting: Family Medicine

## 2022-03-02 DIAGNOSIS — Z1231 Encounter for screening mammogram for malignant neoplasm of breast: Secondary | ICD-10-CM

## 2022-03-06 DIAGNOSIS — H02834 Dermatochalasis of left upper eyelid: Secondary | ICD-10-CM | POA: Diagnosis not present

## 2022-03-06 DIAGNOSIS — H02831 Dermatochalasis of right upper eyelid: Secondary | ICD-10-CM | POA: Diagnosis not present

## 2022-04-05 ENCOUNTER — Ambulatory Visit
Admission: RE | Admit: 2022-04-05 | Discharge: 2022-04-05 | Disposition: A | Discharge status: Home / Self Care | Payer: Medicare PPO | Source: Ambulatory Visit | Attending: Family Medicine | Admitting: Family Medicine

## 2022-04-05 DIAGNOSIS — Z1231 Encounter for screening mammogram for malignant neoplasm of breast: Secondary | ICD-10-CM | POA: Diagnosis not present

## 2022-04-09 DIAGNOSIS — Z6823 Body mass index (BMI) 23.0-23.9, adult: Secondary | ICD-10-CM | POA: Diagnosis not present

## 2022-04-09 DIAGNOSIS — Z0000 Encounter for general adult medical examination without abnormal findings: Secondary | ICD-10-CM | POA: Diagnosis not present

## 2022-04-09 DIAGNOSIS — M810 Age-related osteoporosis without current pathological fracture: Secondary | ICD-10-CM | POA: Diagnosis not present

## 2022-04-09 DIAGNOSIS — Z1389 Encounter for screening for other disorder: Secondary | ICD-10-CM | POA: Diagnosis not present

## 2022-04-09 DIAGNOSIS — E782 Mixed hyperlipidemia: Secondary | ICD-10-CM | POA: Diagnosis not present

## 2022-04-09 DIAGNOSIS — Z79899 Other long term (current) drug therapy: Secondary | ICD-10-CM | POA: Diagnosis not present

## 2022-04-09 DIAGNOSIS — Z23 Encounter for immunization: Secondary | ICD-10-CM | POA: Diagnosis not present

## 2022-04-09 DIAGNOSIS — I351 Nonrheumatic aortic (valve) insufficiency: Secondary | ICD-10-CM | POA: Diagnosis not present

## 2022-04-09 DIAGNOSIS — I340 Nonrheumatic mitral (valve) insufficiency: Secondary | ICD-10-CM | POA: Diagnosis not present

## 2022-04-27 DIAGNOSIS — E782 Mixed hyperlipidemia: Secondary | ICD-10-CM | POA: Diagnosis not present

## 2022-04-27 DIAGNOSIS — Z5181 Encounter for therapeutic drug level monitoring: Secondary | ICD-10-CM | POA: Diagnosis not present

## 2022-04-27 DIAGNOSIS — Z79899 Other long term (current) drug therapy: Secondary | ICD-10-CM | POA: Diagnosis not present

## 2022-05-16 ENCOUNTER — Ambulatory Visit: Payer: Medicare PPO | Attending: Cardiovascular Disease | Admitting: Cardiovascular Disease

## 2022-05-16 ENCOUNTER — Encounter: Payer: Self-pay | Admitting: Cardiovascular Disease

## 2022-05-16 VITALS — BP 120/78 | HR 92 | Ht 62.0 in | Wt 128.8 lb

## 2022-05-16 DIAGNOSIS — I340 Nonrheumatic mitral (valve) insufficiency: Secondary | ICD-10-CM | POA: Diagnosis not present

## 2022-05-16 DIAGNOSIS — I351 Nonrheumatic aortic (valve) insufficiency: Secondary | ICD-10-CM | POA: Diagnosis not present

## 2022-05-16 NOTE — Patient Instructions (Signed)
Medication Instructions:  Your physician recommends that you continue on your current medications as directed. Please refer to the Current Medication list given to you today.  *If you need a refill on your cardiac medications before your next appointment, please call your pharmacy*   Lab Work: None. If you have labs (blood work) drawn today and your tests are completely normal, you will receive your results only by: MyChart Message (if you have MyChart) OR A paper copy in the mail If you have any lab test that is abnormal or we need to change your treatment, we will call you to review the results.   Testing/Procedures: Echo due December 2024 Your physician has requested that you have an echocardiogram in one year (due 04/2023). Echocardiography is a painless test that uses sound waves to create images of your heart. It provides your doctor with information about the size and shape of your heart and how well your heart's chambers and valves are working. This procedure takes approximately one hour. There are no restrictions for this procedure. Please do NOT wear cologne, perfume, aftershave, or lotions (deodorant is allowed). Please arrive 15 minutes prior to your appointment time.    Follow-Up: At [facility], you and your health needs are our priority.  As part of our continuing mission to provide you with exceptional heart care, we have created designated Provider Care Teams.  These Care Teams include your primary Cardiologist (physician) and Advanced Practice Providers (APPs -  Physician Assistants and Nurse Practitioners) who all work together to provide you with the care you need, when you need it.  We recommend signing up for the patient portal called "MyChart".  Sign up information is provided on this After Visit Summary.  MyChart is used to connect with patients for Virtual Visits (Telemedicine).  Patients are able to view lab/test results, encounter notes, upcoming  appointments, etc.  Non-urgent messages can be sent to your provider as well.   To learn more about what you can do with MyChart, go to [email or web address].    Your next appointment:   1 year(s) After Echo is completed.  The format for your next appointment:   In Person  Provider:   Verne Carrow, MD     Important Information About Sugar

## 2022-05-16 NOTE — Progress Notes (Signed)
Chief Complaint  Patient presents with   Follow-up    Mitral regurgitation   History of Present Illness: 76 yo female with history of hyperlipidemia, aortic insufficiency and mitral valve regurgitation here today for cardiac follow up. She had been followed by Dr. Wynonia Lawman. I met her for the first time in August 2020. Echo April 2019 with LVEF=60%. Mild to moderate AI, moderate MR. She was feeling well when I met her in 2020 and had no complaints. Echo December 2022 with LVEF=60-65%, moderate mitral regurgitation, mild aortic valve insufficiency.   She is here today for follow up. The patient denies any chest pain, dyspnea, palpitations, lower extremity edema, orthopnea, PND, dizziness, near syncope or syncope.    Primary Care Physician: Kathyrn Lass, MD  Past Medical History:  Diagnosis Date   Aortic valve insufficiency    Hyperlipidemia    Mitral valve regurgitation     Past Surgical History:  Procedure Laterality Date   APPENDECTOMY      Current Outpatient Medications  Medication Sig Dispense Refill   alendronate (FOSAMAX) 70 MG tablet Take 70 mg by mouth once a week.     atorvastatin (LIPITOR) 10 MG tablet Take 1 tablet by mouth daily.     fenofibrate 54 MG tablet Take 1 tablet by mouth daily.     No current facility-administered medications for this visit.    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  Tobacco Use   Smoking status: Former   Smokeless tobacco: Never  Substance and Sexual Activity   Alcohol use: Yes    Comment: Social-one glass of wine per week   Drug use: Never   Sexual activity: Not on file    Comment: MARRIED  Other Topics Concern   Not on file  Social History Narrative   Not on file    Social Determinants of Health   Financial Resource Strain: Not on file  Food Insecurity: Not on file  Transportation Needs: Not on file  Physical Activity: Not on file  Stress: Not on file  Social Connections: Not on file  Intimate Partner Violence: Not on file    Family History  Problem Relation Age of Onset   Healthy Mother    Mesothelioma Father    Healthy Sister    Cancer - Prostate Brother    Healthy Brother    Healthy Sister    Healthy Sister     Review of Systems:  As stated in the HPI and otherwise negative.   BP 120/78   Pulse 92   Ht _0  (1.575 m)   Wt 128 lb 12.8 oz (58.4 kg)   SpO2 99%   BMI 23.56 kg/m   Physical Examination:  General: Well developed, well nourished, NAD  HEENT: OP clear, mucus membranes moist  SKIN: warm, dry. No rashes. Neuro: No focal deficits  Musculoskeletal: Muscle strength 5/5 all ext  Psychiatric: Mood and affect normal  Neck: No JVD, no carotid bruits, no thyromegaly, no lymphadenopathy.  Lungs:Clear bilaterally, no wheezes, rhonci, crackles Cardiovascular: Regular rate and rhythm. No murmurs, gallops or rubs. Abdomen:Soft. Bowel sounds present. Non-tender.  Extremities: No lower extremity edema. Pulses are 2 + in the bilateral DP/PT.  EKG:  EKG is  ordered today. The ekg ordered today demonstrates NSR, ST and T wave and-unchanged  Echo December 2022:  1. Left ventricular ejection fraction, by estimation, is 60 to 65%. The  left ventricle has normal function. The left ventricle has no regional  wall motion abnormalities. Left ventricular diastolic parameters are  consistent with Grade I diastolic  dysfunction (impaired relaxation).   2. Right ventricular systolic function is normal. The right ventricular  size is normal. There is normal pulmonary artery systolic pressure.   3. PISA 0.5 cm. The mitral valve is normal in structure. Moderate mitral  valve regurgitation. No evidence of mitral stenosis.   4. The aortic  valve is tricuspid. Aortic valve regurgitation is mild. No  aortic stenosis is present. Aortic regurgitation PHT measures 297 msec.   5. The inferior vena cava is normal in size with greater than 50%  respiratory variability, suggesting right atrial pressure of 3 mmHg.   Recent Labs: No results found for requested labs within last 365 days.   Lipid Panel No results found for: "CHOL", "TRIG", "HDL", "CHOLHDL", "VLDL", "LDLCALC", "LDLDIRECT"   Wt Readings from Last 3 Encounters:  05/16/22 128 lb 12.8 oz (58.4 kg)  04/28/21 125 lb (56.7 kg)  01/22/19 122 lb 1.9 oz (55.4 kg)    Assessment and Plan:   1. Aortic valve insufficiency: Mild by echo in December 2022.    2. Mitral valve regurgitation: Moderate by echo in December 2022. Repeat echo in December 2024.   3. Hyperlipidemia: Lipids followed in primary care. Continue statin.   Labs/ tests ordered today include:   Orders Placed This Encounter  Procedures   EKG 12-Lead   ECHOCARDIOGRAM COMPLETE   Disposition:   F/U with me in one year.   Signed, Lauree Chandler, MD 05/16/2022 1:41 PM    [facility] [address], [place], [place]  [address] Phone: [phone]; Fax: [phone]

## 2022-06-20 DIAGNOSIS — H02834 Dermatochalasis of left upper eyelid: Secondary | ICD-10-CM | POA: Diagnosis not present

## 2022-06-20 DIAGNOSIS — H02831 Dermatochalasis of right upper eyelid: Secondary | ICD-10-CM | POA: Diagnosis not present

## 2022-09-24 DIAGNOSIS — D2372 Other benign neoplasm of skin of left lower limb, including hip: Secondary | ICD-10-CM | POA: Diagnosis not present

## 2022-09-24 DIAGNOSIS — L814 Other melanin hyperpigmentation: Secondary | ICD-10-CM | POA: Diagnosis not present

## 2022-09-24 DIAGNOSIS — D224 Melanocytic nevi of scalp and neck: Secondary | ICD-10-CM | POA: Diagnosis not present

## 2022-09-24 DIAGNOSIS — L821 Other seborrheic keratosis: Secondary | ICD-10-CM | POA: Diagnosis not present

## 2022-09-24 DIAGNOSIS — D485 Neoplasm of uncertain behavior of skin: Secondary | ICD-10-CM | POA: Diagnosis not present

## 2022-09-24 DIAGNOSIS — D225 Melanocytic nevi of trunk: Secondary | ICD-10-CM | POA: Diagnosis not present

## 2022-10-11 DIAGNOSIS — Z961 Presence of intraocular lens: Secondary | ICD-10-CM | POA: Diagnosis not present

## 2022-11-19 ENCOUNTER — Ambulatory Visit: Payer: Medicare PPO | Admitting: Cardiovascular Disease

## 2023-02-20 ENCOUNTER — Other Ambulatory Visit: Payer: Self-pay | Admitting: Family Medicine

## 2023-02-20 DIAGNOSIS — Z1231 Encounter for screening mammogram for malignant neoplasm of breast: Secondary | ICD-10-CM

## 2023-03-25 DIAGNOSIS — J029 Acute pharyngitis, unspecified: Secondary | ICD-10-CM | POA: Diagnosis not present

## 2023-03-25 DIAGNOSIS — J039 Acute tonsillitis, unspecified: Secondary | ICD-10-CM | POA: Diagnosis not present

## 2023-03-25 DIAGNOSIS — R590 Localized enlarged lymph nodes: Secondary | ICD-10-CM | POA: Diagnosis not present

## 2023-04-08 ENCOUNTER — Ambulatory Visit
Admission: RE | Admit: 2023-04-08 | Discharge: 2023-04-08 | Disposition: A | Discharge status: Home / Self Care | Payer: Medicare PPO | Source: Ambulatory Visit | Attending: Family Medicine | Admitting: Family Medicine

## 2023-04-08 DIAGNOSIS — Z1231 Encounter for screening mammogram for malignant neoplasm of breast: Secondary | ICD-10-CM

## 2023-04-15 DIAGNOSIS — Z0000 Encounter for general adult medical examination without abnormal findings: Secondary | ICD-10-CM | POA: Diagnosis not present

## 2023-04-15 DIAGNOSIS — Z1159 Encounter for screening for other viral diseases: Secondary | ICD-10-CM | POA: Diagnosis not present

## 2023-04-15 DIAGNOSIS — Z6823 Body mass index (BMI) 23.0-23.9, adult: Secondary | ICD-10-CM | POA: Diagnosis not present

## 2023-04-15 DIAGNOSIS — Z1331 Encounter for screening for depression: Secondary | ICD-10-CM | POA: Diagnosis not present

## 2023-04-15 DIAGNOSIS — E782 Mixed hyperlipidemia: Secondary | ICD-10-CM | POA: Diagnosis not present

## 2023-04-15 DIAGNOSIS — M810 Age-related osteoporosis without current pathological fracture: Secondary | ICD-10-CM | POA: Diagnosis not present

## 2023-04-16 ENCOUNTER — Other Ambulatory Visit: Payer: Self-pay | Admitting: Family Medicine

## 2023-04-16 DIAGNOSIS — M810 Age-related osteoporosis without current pathological fracture: Secondary | ICD-10-CM

## 2023-05-06 ENCOUNTER — Ambulatory Visit (HOSPITAL_COMMUNITY): Payer: Medicare PPO | Attending: Cardiology

## 2023-05-06 DIAGNOSIS — I340 Nonrheumatic mitral (valve) insufficiency: Secondary | ICD-10-CM | POA: Insufficient documentation

## 2023-05-06 LAB — ECHOCARDIOGRAM COMPLETE
Area-P 1/2: 3.39 cm2
MV M vel: 5.69 m/s
MV Peak grad: 129.7 mm[Hg]
P 1/2 time: 450 ms
S' Lateral: 3 cm

## 2023-05-12 NOTE — Progress Notes (Unsigned)
Cardiology Office Note:  .   Date:  05/13/2023  ID:  Monique Barnes, DOB [birth year], MRN [id number] PCP: Sigmund Hazel, MD  [facility] Providers Cardiologist:  Verne Carrow, MD {  History of Present Illness: Monique Barnes is a 77 y.o. female with a past medical history of hyperlipidemia, aortic insufficiency and mitral valve regurgitation here for follow-up appointment.  She was followed by Dr. Donnie Aho.  Now seeing Dr. Clifton James.  Met him for the first time August 2020.  Echo April 2019 with LVEF of 60%, moderate AI, moderate MR.  Was feeling well when he originally met her in 2020 with no complaints.  Echocardiogram August 2020 with LVEF 55 to 60%, mild to moderate MR, mild to moderate AI.  She was last seen in follow-up December 2023 and she denied chest pain, dyspnea, palpitations, lower extremity edema, orthopnea, PND, dizziness, syncope, or near syncope.  Tolerating all medications.  Today, she presents  for a routine follow-up. She reports occasional ankle swelling, particularly after long periods of sitting, such as during a bus ride. However, the swelling typically resolves by the next day. The patient maintains an active lifestyle, regularly attending the gym for exercise. She is currently on a few medications, including Lipitor 10mg  at night. The patient also mentions a planned trip to [place] and intends to take precautions for the long flight, such as wearing compression socks and moving around regularly.  Recent echo reviewed with the patient.  Reports no shortness of breath nor dyspnea on exertion. Reports no chest pain, pressure, or tightness. No edema, orthopnea, PND. Reports no palpitations.   Discussed the use of AI scribe software for clinical note transcription with the patient, who gave verbal consent to proceed.   ROS: Pertinent ROS in HPI  Studies Reviewed: .        Echo August 2020:  1. The left ventricle has normal systolic function, with an  ejection  fraction of 55-60%. The cavity size was normal. Left ventricular diastolic  Doppler parameters are consistent with impaired relaxation.   2. The right ventricle has normal systolic function. The cavity was  normal. There is no increase in right ventricular wall thickness.   3. Moderate thickening of the mitral valve leaflet. Moderate  calcification of the mitral valve leaflet. Mitral valve regurgitation is  mild to moderate by color flow Doppler.   4. The aortic valve is tricuspid. Moderate thickening of the aortic  valve. Mild calcification of the aortic valve. Aortic valve regurgitation  is mild to moderate by color flow Doppler.   5. The aorta is normal unless otherwise noted.        Physical Exam:   VS:  BP 118/78   Pulse 79   Ht 5\' 2"  (1.575 m)   Wt 129 lb 12.8 oz (58.9 kg)   SpO2 98%   BMI 23.74 kg/m    Wt Readings from Last 3 Encounters:  05/13/23 129 lb 12.8 oz (58.9 kg)  05/16/22 128 lb 12.8 oz (58.4 kg)  04/28/21 125 lb (56.7 kg)    GEN: Well nourished, well developed in no acute distress NECK: No JVD; No carotid bruits CARDIAC: RRR, no murmurs, rubs, gallops RESPIRATORY:  Clear to auscultation without rales, wheezing or rhonchi  ABDOMEN: Soft, non-tender, non-distended EXTREMITIES:  No edema; No deformity   ASSESSMENT AND PLAN: .     Mitral Regurgitation and Aortic Insufficiency Moderate mitral regurgitation and mild aortic insufficiency noted on recent echocardiogram. No symptoms  of heart failure such as increased shortness of breath or persistent leg swelling. Discussed potential need for transesophageal echocardiogram (TEE) if symptoms develop. -Continue current management and monitoring. -Consider TEE if symptoms of heart failure develop.  Ankle Swelling Occasional ankle swelling noted, particularly after prolonged sitting. Likely due to gravity and venous stasis rather than cardiac cause. -Advise to wear compression stockings during long  travel. -Encourage regular movement and exercise.  Hyperlipidemia Currently managed with Lipitor 10mg  at night. -Continue Lipitor 10mg  at night. -Check lipid panel on 05/14/2023 with primary care provider.  General Health Maintenance -Continue regular exercise and healthy diet. -Consider reducing salt intake to further reduce ankle swelling.      Dispo: She will follow-up in a year.   Signed, Sharlene Dory, PA-C

## 2023-05-13 ENCOUNTER — Ambulatory Visit: Payer: Medicare PPO | Attending: Physician Assistant | Admitting: Physician Assistant

## 2023-05-13 ENCOUNTER — Encounter: Payer: Self-pay | Admitting: Physician Assistant

## 2023-05-13 VITALS — BP 118/78 | HR 79 | Ht 62.0 in | Wt 129.8 lb

## 2023-05-13 DIAGNOSIS — I351 Nonrheumatic aortic (valve) insufficiency: Secondary | ICD-10-CM

## 2023-05-13 DIAGNOSIS — E785 Hyperlipidemia, unspecified: Secondary | ICD-10-CM | POA: Diagnosis not present

## 2023-05-13 DIAGNOSIS — I340 Nonrheumatic mitral (valve) insufficiency: Secondary | ICD-10-CM | POA: Diagnosis not present

## 2023-05-13 NOTE — Patient Instructions (Signed)
Medication Instructions:  Your physician recommends that you continue on your current medications as directed. Please refer to the Current Medication list given to you today.  *If you need a refill on your cardiac medications before your next appointment, please call your pharmacy*   Lab Work: None ordered   If you have labs (blood work) drawn today and your tests are completely normal, you will receive your results only by: MyChart Message (if you have MyChart) OR A paper copy in the mail If you have any lab test that is abnormal or we need to change your treatment, we will call you to review the results.   Testing/Procedures: None ordered    Follow-Up: At [facility], you and your health needs are our priority.  As part of our continuing mission to provide you with exceptional heart care, we have created designated Provider Care Teams.  These Care Teams include your primary Cardiologist (physician) and Advanced Practice Providers (APPs -  Physician Assistants and Nurse Practitioners) who all work together to provide you with the care you need, when you need it.  We recommend signing up for the patient portal called "MyChart".  Sign up information is provided on this After Visit Summary.  MyChart is used to connect with patients for Virtual Visits (Telemedicine).  Patients are able to view lab/test results, encounter notes, upcoming appointments, etc.  Non-urgent messages can be sent to your provider as well.   To learn more about what you can do with MyChart, go to [email or web address].    Your next appointment:   12 month(s)  Provider:   Christopher McAlhany, MD     Other Instructions   

## 2023-05-14 DIAGNOSIS — M810 Age-related osteoporosis without current pathological fracture: Secondary | ICD-10-CM | POA: Diagnosis not present

## 2023-05-14 DIAGNOSIS — Z1159 Encounter for screening for other viral diseases: Secondary | ICD-10-CM | POA: Diagnosis not present

## 2023-05-14 DIAGNOSIS — E782 Mixed hyperlipidemia: Secondary | ICD-10-CM | POA: Diagnosis not present

## 2023-10-14 DIAGNOSIS — Z961 Presence of intraocular lens: Secondary | ICD-10-CM | POA: Diagnosis not present

## 2023-10-24 DIAGNOSIS — L905 Scar conditions and fibrosis of skin: Secondary | ICD-10-CM | POA: Diagnosis not present

## 2023-10-24 DIAGNOSIS — D225 Melanocytic nevi of trunk: Secondary | ICD-10-CM | POA: Diagnosis not present

## 2023-10-24 DIAGNOSIS — L821 Other seborrheic keratosis: Secondary | ICD-10-CM | POA: Diagnosis not present

## 2023-10-24 DIAGNOSIS — L918 Other hypertrophic disorders of the skin: Secondary | ICD-10-CM | POA: Diagnosis not present

## 2023-10-24 DIAGNOSIS — L814 Other melanin hyperpigmentation: Secondary | ICD-10-CM | POA: Diagnosis not present

## 2023-10-24 DIAGNOSIS — D1801 Hemangioma of skin and subcutaneous tissue: Secondary | ICD-10-CM | POA: Diagnosis not present

## 2023-12-02 ENCOUNTER — Other Ambulatory Visit (HOSPITAL_COMMUNITY): Payer: Self-pay

## 2023-12-04 ENCOUNTER — Other Ambulatory Visit: Payer: Medicare PPO

## 2023-12-04 ENCOUNTER — Ambulatory Visit (HOSPITAL_BASED_OUTPATIENT_CLINIC_OR_DEPARTMENT_OTHER)
Admission: RE | Admit: 2023-12-04 | Discharge: 2023-12-04 | Disposition: A | Discharge status: Home / Self Care | Source: Ambulatory Visit | Attending: Family Medicine | Admitting: Family Medicine

## 2023-12-04 DIAGNOSIS — Z780 Asymptomatic menopausal state: Secondary | ICD-10-CM | POA: Diagnosis not present

## 2023-12-04 DIAGNOSIS — M8589 Other specified disorders of bone density and structure, multiple sites: Secondary | ICD-10-CM | POA: Diagnosis not present

## 2023-12-04 DIAGNOSIS — M810 Age-related osteoporosis without current pathological fracture: Secondary | ICD-10-CM | POA: Insufficient documentation

## 2024-01-17 ENCOUNTER — Other Ambulatory Visit (HOSPITAL_COMMUNITY): Payer: Self-pay

## 2024-01-21 ENCOUNTER — Other Ambulatory Visit: Payer: Self-pay

## 2024-01-21 ENCOUNTER — Other Ambulatory Visit (HOSPITAL_COMMUNITY): Payer: Self-pay

## 2024-01-21 MED ORDER — FENOFIBRATE 54 MG PO TABS
54.0000 mg | ORAL_TABLET | Freq: Every day | ORAL | 0 refills | Status: DC
  Filled 2024-01-21: qty 90, 90d supply, fill #0

## 2024-01-21 MED ORDER — ATORVASTATIN CALCIUM 10 MG PO TABS
10.0000 mg | ORAL_TABLET | Freq: Every day | ORAL | 0 refills | Status: DC
  Filled 2024-01-21: qty 90, 90d supply, fill #0

## 2024-01-21 MED ORDER — ALENDRONATE SODIUM 70 MG PO TABS
70.0000 mg | ORAL_TABLET | ORAL | 0 refills | Status: DC
  Filled 2024-01-21: qty 12, 84d supply, fill #0

## 2024-02-24 ENCOUNTER — Other Ambulatory Visit: Payer: Self-pay | Admitting: Family Medicine

## 2024-02-24 DIAGNOSIS — Z1231 Encounter for screening mammogram for malignant neoplasm of breast: Secondary | ICD-10-CM

## 2024-04-08 ENCOUNTER — Ambulatory Visit
Admission: RE | Admit: 2024-04-08 | Discharge: 2024-04-08 | Disposition: A | Discharge status: Home / Self Care | Source: Ambulatory Visit | Attending: Family Medicine | Admitting: Family Medicine

## 2024-04-08 DIAGNOSIS — E782 Mixed hyperlipidemia: Secondary | ICD-10-CM | POA: Diagnosis not present

## 2024-04-08 DIAGNOSIS — Z1231 Encounter for screening mammogram for malignant neoplasm of breast: Secondary | ICD-10-CM | POA: Diagnosis not present

## 2024-04-08 DIAGNOSIS — Z79899 Other long term (current) drug therapy: Secondary | ICD-10-CM | POA: Diagnosis not present

## 2024-04-14 ENCOUNTER — Other Ambulatory Visit: Payer: Self-pay

## 2024-04-14 ENCOUNTER — Other Ambulatory Visit (HOSPITAL_COMMUNITY): Payer: Self-pay

## 2024-04-14 MED ORDER — ALENDRONATE SODIUM 70 MG PO TABS
70.0000 mg | ORAL_TABLET | ORAL | 0 refills | Status: AC
  Filled 2024-04-14: qty 12, 84d supply, fill #0

## 2024-04-14 MED ORDER — FENOFIBRATE 54 MG PO TABS
54.0000 mg | ORAL_TABLET | Freq: Every day | ORAL | 3 refills | Status: AC
  Filled 2024-04-14: qty 90, 90d supply, fill #0

## 2024-04-14 MED ORDER — ATORVASTATIN CALCIUM 10 MG PO TABS
10.0000 mg | ORAL_TABLET | Freq: Every day | ORAL | 3 refills | Status: AC
  Filled 2024-04-14 (×2): qty 90, 90d supply, fill #0

## 2024-04-15 DIAGNOSIS — I340 Nonrheumatic mitral (valve) insufficiency: Secondary | ICD-10-CM | POA: Diagnosis not present

## 2024-04-15 DIAGNOSIS — M8580 Other specified disorders of bone density and structure, unspecified site: Secondary | ICD-10-CM | POA: Diagnosis not present

## 2024-04-15 DIAGNOSIS — I351 Nonrheumatic aortic (valve) insufficiency: Secondary | ICD-10-CM | POA: Diagnosis not present

## 2024-04-15 DIAGNOSIS — E782 Mixed hyperlipidemia: Secondary | ICD-10-CM | POA: Diagnosis not present

## 2024-04-15 DIAGNOSIS — Z0000 Encounter for general adult medical examination without abnormal findings: Secondary | ICD-10-CM | POA: Diagnosis not present

## 2024-04-15 DIAGNOSIS — Z1331 Encounter for screening for depression: Secondary | ICD-10-CM | POA: Diagnosis not present

## 2024-05-18 NOTE — Progress Notes (Unsigned)
 " Cardiology Office Note:  .   Date:  05/19/2024  ID:  Monique Barnes, DOB April 14, 1946, MRN [id number] PCP: Cleotilde Planas, MD  [facility] Providers Cardiologist:  Lonni Cash, MD {  History of Present Illness: Monique Barnes is a 78 y.o. female with a past medical history of hyperlipidemia, aortic insufficiency and mitral valve regurgitation here for follow-up appointment.  She was followed by Dr. Blanca.  Now seeing Dr. Cash.  Met him for the first time August 2020.  Echo April 2019 with LVEF of 60%, moderate AI, moderate MR.  Was feeling well when he originally met her in 2020 with no complaints.  Echocardiogram August 2020 with LVEF 55 to 60%, mild to moderate MR, mild to moderate AI.  She was last seen in follow-up December 2023 and she denied chest pain, dyspnea, palpitations, lower extremity edema, orthopnea, PND, dizziness, syncope, or near syncope.  Tolerating all medications.  I saw her 05/13/2023, she presents  for a routine follow-up. She reports occasional ankle swelling, particularly after long periods of sitting, such as during a bus ride. However, the swelling typically resolves by the next day. The patient maintains an active lifestyle, regularly attending the gym for exercise. She is currently on a few medications, including Lipitor 10mg  at night. The patient also mentions a planned trip to [place] and intends to take precautions for the long flight, such as wearing compression socks and moving around regularly.  Recent echo reviewed with the patient.  Reports no shortness of breath nor dyspnea on exertion. Reports no chest pain, pressure, or tightness. No edema, orthopnea, PND. Reports no palpitations.   Discussed the use of AI scribe software for clinical note transcription with the patient, who gave verbal consent to proceed.  Today, she has a history of mitral and aortic valve regurgitation who presents for a cardiovascular follow-up.  She reports no  change in health since her last visit. Her echocardiogram in December 2024 showed mild to moderate mitral and aortic regurgitation with normal systolic function. She has no new dyspnea or reduced exercise tolerance.  She has not noticed persistent tachycardia. She tracks her heart rate on a smartwatch and has not seen consistent elevations or associated symptoms.  She notes ankle swelling only during long international flights, including trips to [place] in December 2024 and [place] in August 2025. She has no edema with routine daily activity.  Her LDL cholesterol is 79. Her triglycerides remain elevated at 206 while on fenofibrate  and fish oil. She eats a high-carbohydrate diet with regular bread and pasta intake, drinks about one glass of wine weekly, and has a family history of high triglycerides.  Reports no shortness of breath nor dyspnea on exertion. Reports no chest pain, pressure, or tightness. No edema, orthopnea, PND. Reports no palpitations.   Discussed the use of AI scribe software for clinical note transcription with the patient, who gave verbal consent to proceed.  ROS: Pertinent ROS in HPI  Studies Reviewed: SABRA   EKG Interpretation Date/Time:  Tuesday May 19 2024 13:18:04 EST Ventricular Rate:  95 PR Interval:  112 QRS Duration:  70 QT Interval:  336 QTC Calculation: 422 R Axis:   65  Text Interpretation: Normal sinus rhythm Nonspecific ST abnormality When compared with ECG of 13-May-2023 11:44, No significant change was found Confirmed by Lucien Blanc [phone]) on 05/19/2024 1:29:15 PM    Echo August 2020:  1. The left ventricle has normal systolic function, with an ejection  fraction of 55-60%. The cavity size was normal. Left ventricular diastolic  Doppler parameters are consistent with impaired relaxation.   2. The right ventricle has normal systolic function. The cavity was  normal. There is no increase in right ventricular wall thickness.   3. Moderate  thickening of the mitral valve leaflet. Moderate  calcification of the mitral valve leaflet. Mitral valve regurgitation is  mild to moderate by color flow Doppler.   4. The aortic valve is tricuspid. Moderate thickening of the aortic  valve. Mild calcification of the aortic valve. Aortic valve regurgitation  is mild to moderate by color flow Doppler.   5. The aorta is normal unless otherwise noted.        Physical Exam:   VS:  BP 126/74   Pulse 95   Ht 5' 2 (1.575 m)   Wt 126 lb (57.2 kg)   SpO2 98%   BMI 23.05 kg/m    Wt Readings from Last 3 Encounters:  05/19/24 126 lb (57.2 kg)  05/13/23 129 lb 12.8 oz (58.9 kg)  05/16/22 128 lb 12.8 oz (58.4 kg)    GEN: Well nourished, well developed in no acute distress NECK: No JVD; No carotid bruits CARDIAC: RRR, no murmurs, rubs, gallops RESPIRATORY:  Clear to auscultation without rales, wheezing or rhonchi  ABDOMEN: Soft, non-tender, non-distended EXTREMITIES:  No edema; No deformity   ASSESSMENT AND PLAN: .    Nonrheumatic aortic valve insufficiency Mild to moderate insufficiency with normal heart function, no symptoms requiring intervention. - Monitor with echocardiogram biennially. - Next echocardiogram scheduled for December next year.  Nonrheumatic mitral valve regurgitation Mild to moderate regurgitation with normal heart function, no symptoms requiring intervention. - Monitor with echocardiogram biennially. - Next echocardiogram scheduled for December next year.  Ankle Swelling Occasional ankle swelling noted, particularly after prolonged sitting. Likely due to gravity and venous stasis rather than cardiac cause. -Advise to wear compression stockings during long travel. -Encourage regular movement and exercise.  Hyperlipidemia Currently managed with Lipitor 10mg  at night. -Continue Lipitor 10mg  at night. -Tgs are elevated, she was counciled on limiting simple carbs, sweets, and alcohol       Dispo: She will  follow-up in a year.   Signed, Orren LOISE Fabry, PA-C   "

## 2024-05-19 ENCOUNTER — Encounter: Payer: Self-pay | Admitting: Physician Assistant

## 2024-05-19 ENCOUNTER — Ambulatory Visit: Attending: Physician Assistant | Admitting: Physician Assistant

## 2024-05-19 VITALS — BP 126/74 | HR 95 | Ht 62.0 in | Wt 126.0 lb

## 2024-05-19 DIAGNOSIS — I340 Nonrheumatic mitral (valve) insufficiency: Secondary | ICD-10-CM | POA: Diagnosis not present

## 2024-05-19 DIAGNOSIS — I351 Nonrheumatic aortic (valve) insufficiency: Secondary | ICD-10-CM | POA: Diagnosis not present

## 2024-05-19 DIAGNOSIS — E785 Hyperlipidemia, unspecified: Secondary | ICD-10-CM

## 2024-05-19 NOTE — Patient Instructions (Signed)
 Medication Instructions:  Your physician recommends that you continue on your current medications as directed. Please refer to the Current Medication list given to you today.  *If you need a refill on your cardiac medications before your next appointment, please call your pharmacy*  Lab Work: None ordered If you have labs (blood work) drawn today and your tests are completely normal, you will receive your results only by: MyChart Message (if you have MyChart) OR A paper copy in the mail If you have any lab test that is abnormal or we need to change your treatment, we will call you to review the results.  Testing/Procedures: Echocardiogram in Dec. 2026  Follow-Up: At [facility], you and your health needs are our priority.  As part of our continuing mission to provide you with exceptional heart care, our providers are all part of one team.  This team includes your primary Cardiologist (physician) and Advanced Practice Providers or APPs (Physician Assistants and Nurse Practitioners) who all work together to provide you with the care you need, when you need it.  Your next appointment:   1 year(s)  Provider:   Lonni Cash, MD    We recommend signing up for the patient portal called MyChart.  Sign up information is provided on this After Visit Summary.  MyChart is used to connect with patients for Virtual Visits (Telemedicine).  Patients are able to view lab/test results, encounter notes, upcoming appointments, etc.  Non-urgent messages can be sent to your provider as well.   To learn more about what you can do with MyChart, go to [email or web address].   Other Instructions Your physician has requested that you have an echocardiogram. Echocardiography is a painless test that uses sound waves to create images of your heart. It provides your doctor with information about the size and shape of your heart and how well your hearts chambers and valves are working. This  procedure takes approximately one hour. There are no restrictions for this procedure. Please do NOT wear cologne, perfume, aftershave, or lotions (deodorant is allowed). Please arrive 15 minutes prior to your appointment time.  Please note: We ask at that you not bring children with you during ultrasound (echo/ vascular) testing. Due to room size and safety concerns, children are not allowed in the ultrasound rooms during exams. Our front office staff cannot provide observation of children in our lobby area while testing is being conducted. An adult accompanying a patient to their appointment will only be allowed in the ultrasound room at the discretion of the ultrasound technician under special circumstances. We apologize for any inconvenience.

## 2025-05-18 ENCOUNTER — Ambulatory Visit (HOSPITAL_COMMUNITY)
